# Patient Record
Sex: Female | Born: 2000 | Race: White | Hispanic: No | Marital: Single | Smoking: Never smoker
Health system: Southern US, Community
[De-identification: ages and names within clinical notes are randomized; demographics above are authoritative.]

## PROBLEM LIST (undated history)

## (undated) HISTORY — PX: WISDOM TOOTH EXTRACTION: SHX21

---
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
## 2019-03-15 ENCOUNTER — Ambulatory Visit (HOSPITAL_COMMUNITY)
Admission: EM | Admit: 2019-03-15 | Discharge: 2019-03-15 | Disposition: A | Discharge status: Home / Self Care | Payer: BC Managed Care – PPO | Attending: Family Medicine | Admitting: Family Medicine

## 2019-03-15 ENCOUNTER — Other Ambulatory Visit: Payer: Self-pay

## 2019-03-15 ENCOUNTER — Encounter (HOSPITAL_COMMUNITY): Payer: Self-pay

## 2019-03-15 DIAGNOSIS — J029 Acute pharyngitis, unspecified: Secondary | ICD-10-CM | POA: Diagnosis not present

## 2019-03-15 DIAGNOSIS — Z20828 Contact with and (suspected) exposure to other viral communicable diseases: Secondary | ICD-10-CM | POA: Insufficient documentation

## 2019-03-15 DIAGNOSIS — R05 Cough: Secondary | ICD-10-CM | POA: Diagnosis not present

## 2019-03-15 DIAGNOSIS — J069 Acute upper respiratory infection, unspecified: Secondary | ICD-10-CM

## 2019-03-15 LAB — POCT RAPID STREP A: Streptococcus, Group A Screen (Direct): NEGATIVE

## 2019-03-15 MED ORDER — CETIRIZINE HCL 10 MG PO TABS: 10.0000 mg | ORAL_TABLET | Freq: Every day | ORAL | 0 refills | Status: DC

## 2019-03-15 MED ORDER — FLUTICASONE PROPIONATE 50 MCG/ACT NA SUSP: 1.0000 | Freq: Every day | NASAL | 2 refills | Status: DC

## 2019-03-15 NOTE — ED Triage Notes (Signed)
Pt report having nasal congestion, headache, sore throat  and cough x 3 days.

## 2019-03-15 NOTE — Discharge Instructions (Addendum)
Viral illness vs allergies You can keep taking the OTC cold medication as needed.  Flonase and Zyrtec sent to the pharmacy for symptoms We will call you if your COVID test is positive. Rest, stay hydrated Follow up as needed for continued or worsening symptoms

## 2019-03-15 NOTE — ED Provider Notes (Signed)
[facility]    CSN: [id number] Arrival date & time: 03/15/19  1214      History   Chief Complaint Chief Complaint  Patient presents with  . Appointment    appt 12:30  . Sore Throat  . Headache  . Nasal Congestion  . Cough    HPI Monica Joseph is a 18 y.o. female.    URI Presenting symptoms: congestion, cough, rhinorrhea and sore throat   Severity:  Moderate Duration:  2 days Timing:  Constant Progression:  Waxing and waning Chronicity:  New Relieved by:  Nothing Worsened by:  Nothing Ineffective treatments:  OTC medications Associated symptoms: sinus pain and sneezing   Associated symptoms: no arthralgias, no myalgias, no neck pain, no swollen glands and no wheezing   Risk factors: no recent illness, no recent travel and no sick contacts     History reviewed. No pertinent past medical history.  There are no active problems to display for this patient.   Past Surgical History:  Procedure Laterality Date  . WISDOM TOOTH EXTRACTION      OB History   No obstetric history on file.      Home Medications    Prior to Admission medications   Medication Sig Start Date End Date Taking? Authorizing Provider  DM-Phenylephrine-Acetaminophen (TYLENOL COLD MAX PO) Take by mouth.   Yes [provider]  cetirizine (ZYRTEC) 10 MG tablet Take 1 tablet (10 mg total) by mouth daily. 03/15/19   Tamerra Merkley, Gloris Manchester A, NP  fluticasone (FLONASE) 50 MCG/ACT nasal spray Place 1 spray into both nostrils daily. 03/15/19   Janace Aris, NP    Family History History reviewed. No pertinent family history.  Social History Social History   Tobacco Use  . Smoking status: Never Smoker  . Smokeless tobacco: Never Used  Substance Use Topics  . Alcohol use: Never    Frequency: Never  . Drug use: Not on file     Allergies   Amoxicillin   Review of Systems Review of Systems  HENT: Positive for congestion, rhinorrhea, sinus pain, sneezing and sore throat.    Respiratory: Positive for cough. Negative for wheezing.   Musculoskeletal: Negative for arthralgias, myalgias and neck pain.     Physical Exam Triage Vital Signs ED Triage Vitals  Enc Vitals Group     BP 03/15/19 1303 125/80     Pulse Rate 03/15/19 1303 79     Resp 03/15/19 1303 15     Temp 03/15/19 1303 98.2 F (36.8 C)     Temp Source 03/15/19 1303 Temporal     SpO2 03/15/19 1303 98 %     Weight --      Height --      Head Circumference --      Peak Flow --      Pain Score 03/15/19 1301 2     Pain Loc --      Pain Edu? --      Excl. in GC? --    No data found.  Updated Vital Signs BP 125/80 (BP Location: Right Arm)   Pulse 79   Temp 98.2 F (36.8 C) (Temporal)   Resp 15   SpO2 98%   Visual Acuity Right Eye Distance:   Left Eye Distance:   Bilateral Distance:    Right Eye Near:   Left Eye Near:    Bilateral Near:     Physical Exam Vitals signs and nursing note reviewed.  Constitutional:  General: She is not in acute distress.    Appearance: She is well-developed. She is not ill-appearing, toxic-appearing or diaphoretic.  HENT:     Head: Normocephalic and atraumatic.     Right Ear: Tympanic membrane and ear canal normal.     Left Ear: Tympanic membrane and ear canal normal.     Nose: Congestion and rhinorrhea present.     Mouth/Throat:     Pharynx: Oropharynx is clear. Posterior oropharyngeal erythema present.     Tonsils: 1+ on the right. 1+ on the left.  Eyes:     Conjunctiva/sclera: Conjunctivae normal.  Neck:     Musculoskeletal: Normal range of motion.  Cardiovascular:     Rate and Rhythm: Normal rate and regular rhythm.  Pulmonary:     Effort: Pulmonary effort is normal.     Breath sounds: Normal breath sounds.  Skin:    General: Skin is warm.  Neurological:     Mental Status: She is alert.  Psychiatric:        Mood and Affect: Mood normal.      UC Treatments / Results  Labs (all labs ordered are listed, but only abnormal  results are displayed) Labs Reviewed  NOVEL CORONAVIRUS, NAA (HOSP ORDER, SEND-OUT TO REF LAB; TAT 18-24 HRS)  CULTURE, GROUP A STREP [facility])  POCT RAPID STREP A    EKG   Radiology No results found.  Procedures Procedures (including critical care time)  Medications Ordered in UC Medications - No data to display  Initial Impression / Assessment and Plan / UC Course  I have reviewed the triage vital signs and the nursing notes.  Pertinent labs & imaging results that were available during my care of the patient were reviewed by me and considered in my medical decision making (see chart for details).     Viral URI-over-the-counter symptomatic treatment Flonase and Zyrtec for symptoms COVID test pending Rapid strep test negative Rest and stay hydrated Follow up as needed for continued or worsening symptoms  Final Clinical Impressions(s) / UC Diagnoses   Final diagnoses:  Viral URI with cough     Discharge Instructions     Viral illness vs allergies You can keep taking the OTC cold medication as needed.  Flonase and Zyrtec sent to the pharmacy for symptoms We will call you if your COVID test is positive. Rest, stay hydrated Follow up as needed for continued or worsening symptoms     ED Prescriptions    Medication Sig Dispense Auth. Provider   fluticasone (FLONASE) 50 MCG/ACT nasal spray Place 1 spray into both nostrils daily. 16 g Ambri Miltner A, NP   cetirizine (ZYRTEC) 10 MG tablet Take 1 tablet (10 mg total) by mouth daily. 30 tablet Loura Halt A, NP     PDMP not reviewed this encounter.   Loura Halt A, NP 03/15/19 1332

## 2019-03-16 LAB — NOVEL CORONAVIRUS, NAA (HOSP ORDER, SEND-OUT TO REF LAB; TAT 18-24 HRS): SARS-CoV-2, NAA: NOT DETECTED

## 2019-03-18 LAB — CULTURE, GROUP A STREP (THRC)

## 2019-04-21 IMAGING — CT CT MAXILLOFACIAL W/O CM
3 series · 16 of 47 positions shown, 19 images · non-contrast
Comparison: None.

CLINICAL DATA: Hit on right side of jaw with softball, with right
jaw pain. Initial encounter.

EXAM:
CT MAXILLOFACIAL WITHOUT CONTRAST
TECHNIQUE: Multidetector CT imaging of the maxillofacial structures was
performed. Multiplanar CT image reconstructions were also generated.

[Series 3: facial st · axial · 0.33mm/px · z∈[-210,-86]mm · 10 of 72 slices shown, 13 images]
[im 5/72  brain]
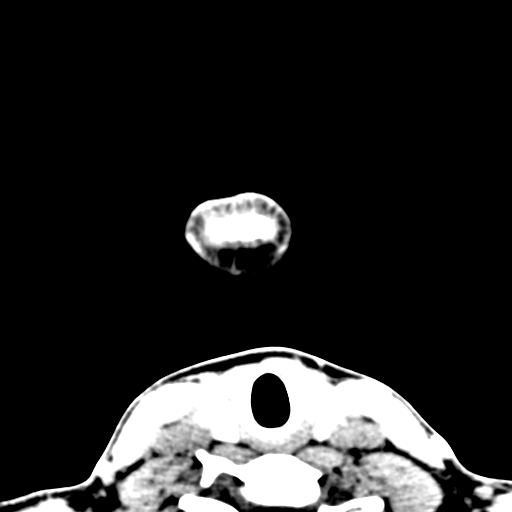
[im 5/72  bone]
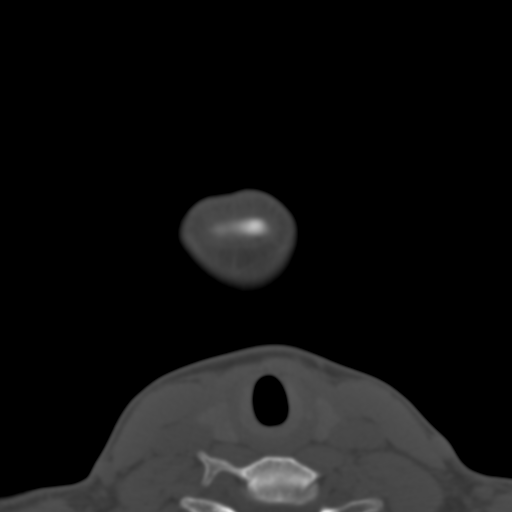
[im 13/72  bone]
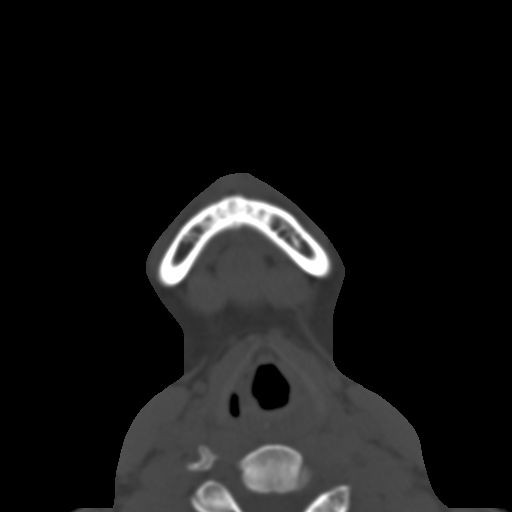
[im 20/72  bone]
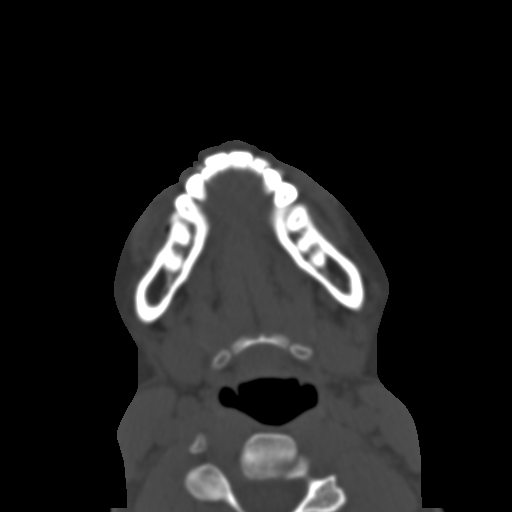
[im 25/72  bone]
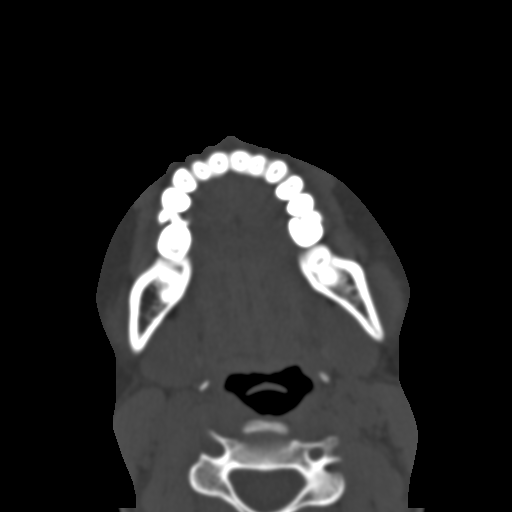
[im 32/72  brain]
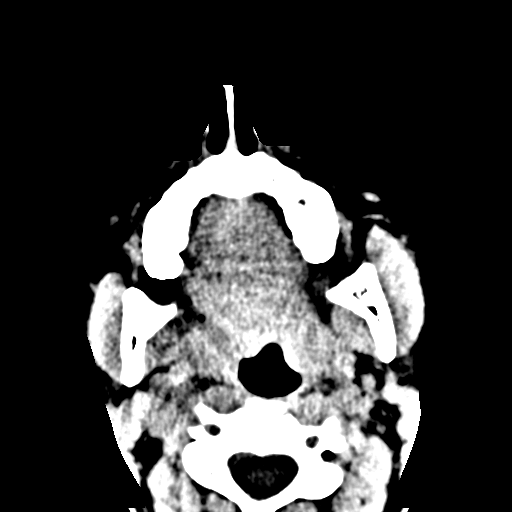
[im 32/72  bone]
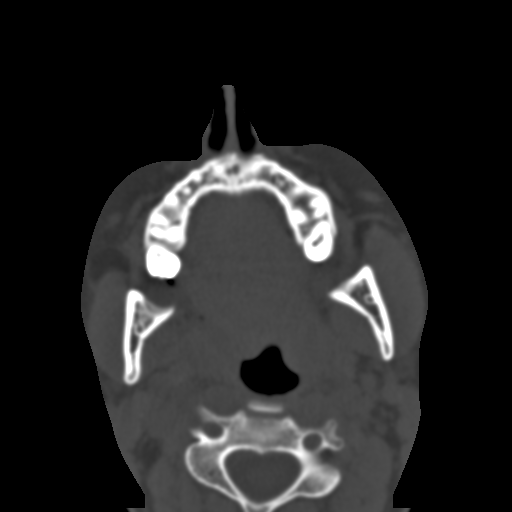
[im 40/72  bone]
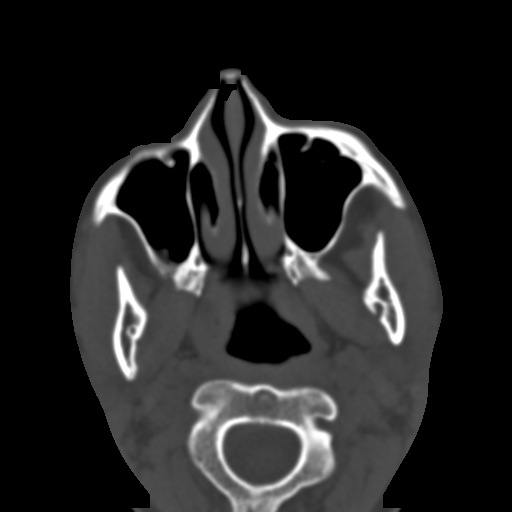
[im 47/72  bone]
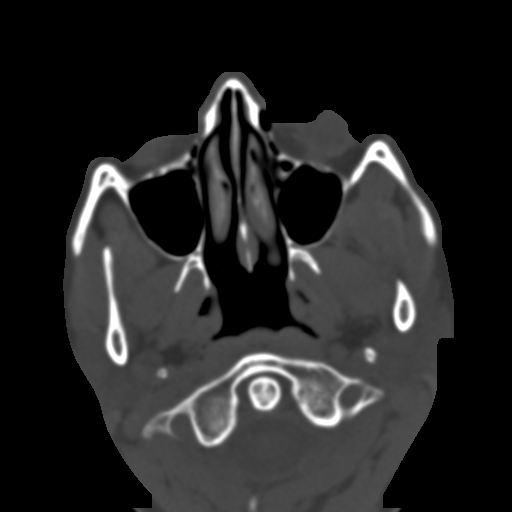
[im 54/72  bone]
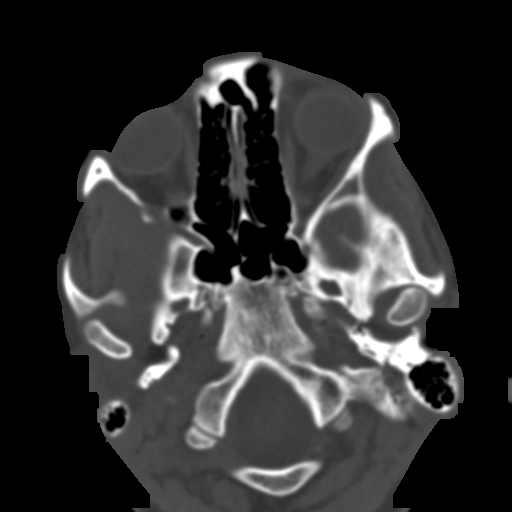
[im 59/72  brain]
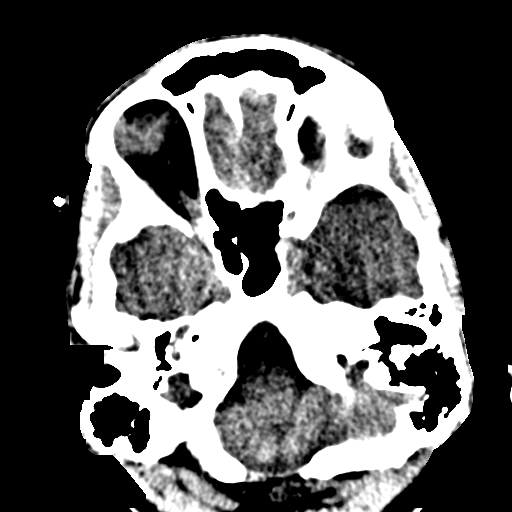
[im 59/72  bone]
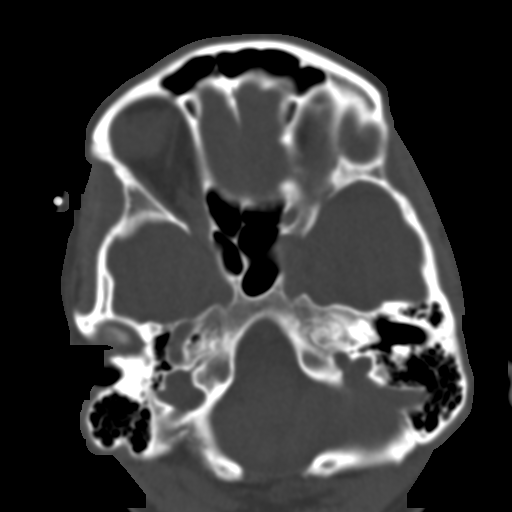
[im 67/72  bone]
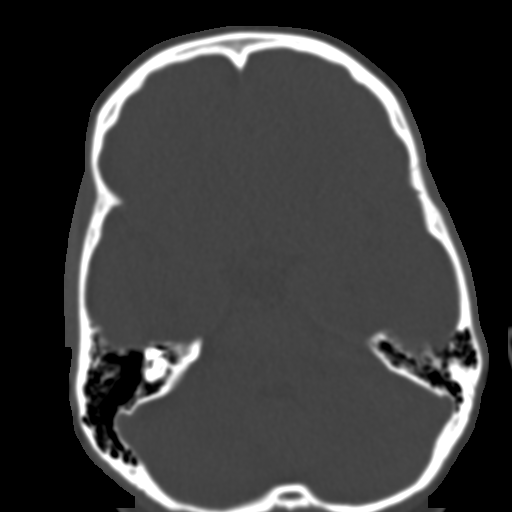

[Series 5: coronal st · coronal · 0.30mm/px · 3 of 78 slices shown]
[im 26/78  bone]
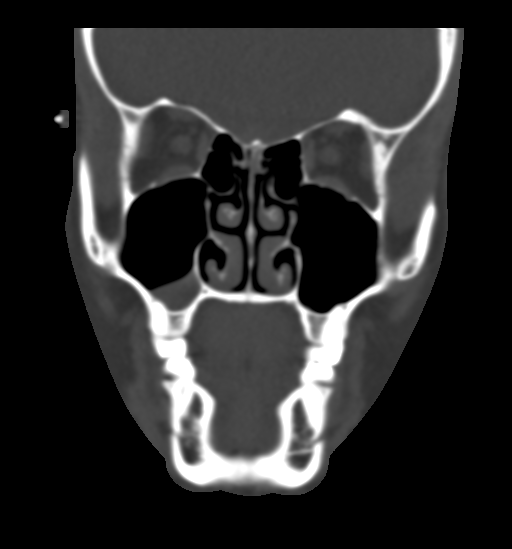
[im 35/78  bone]
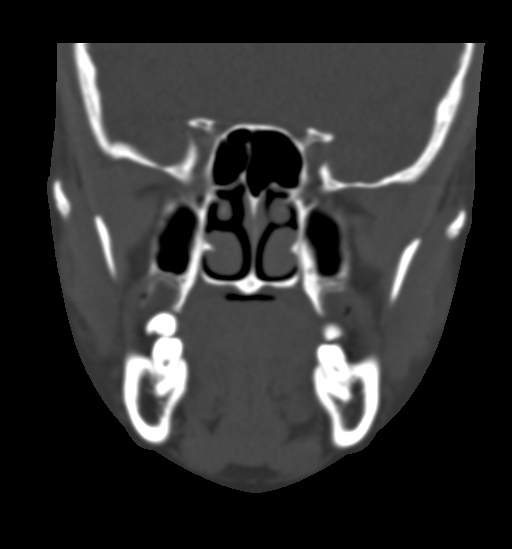
[im 43/78  bone]
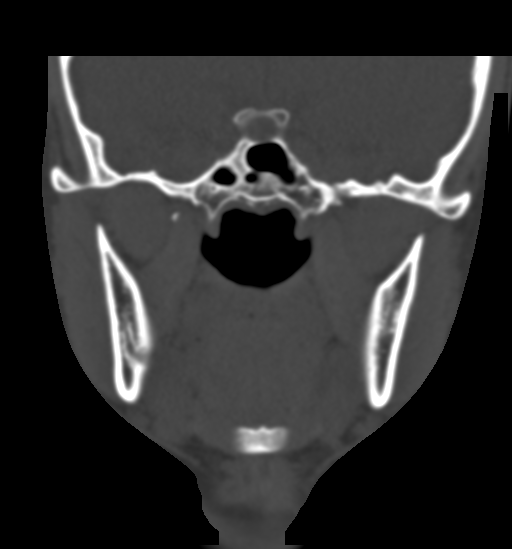

[Series 6: sagittal st · sagittal · 0.30mm/px · 3 of 73 slices shown]
[im 25/73  bone]
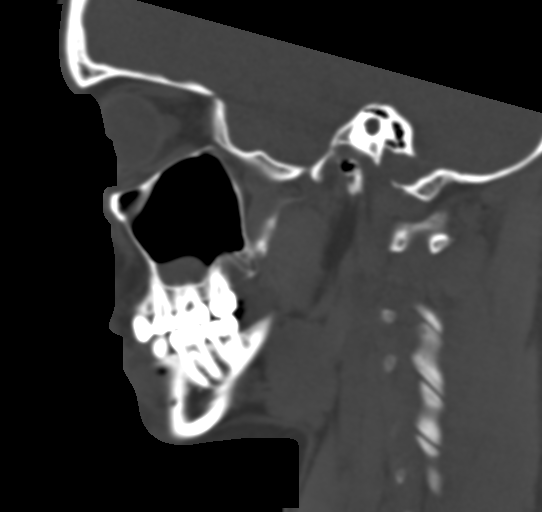
[im 37/73  bone]
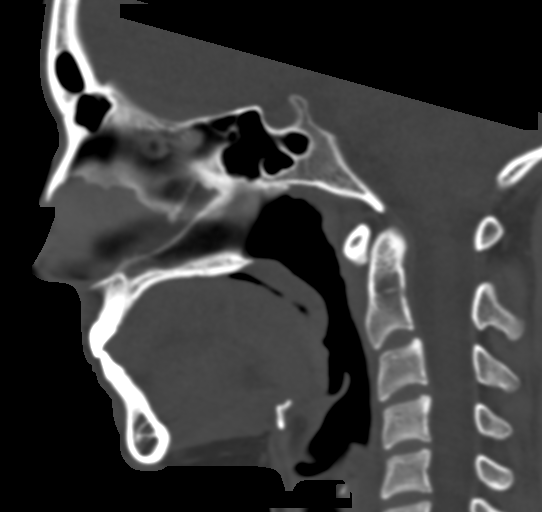
[im 49/73  bone]
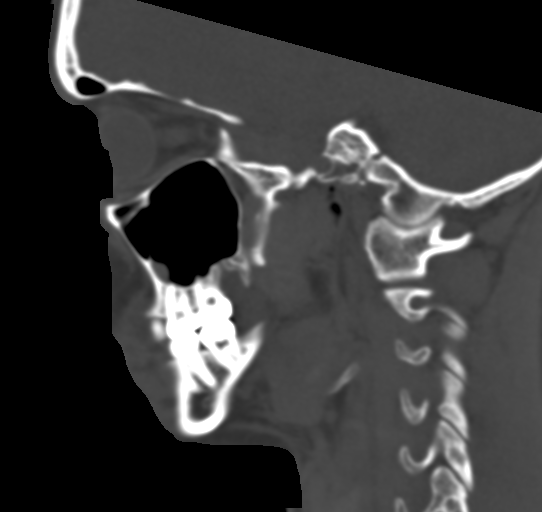

[16 of 47 positions shown; findings below may reference images not displayed]

FINDINGS: Osseous: There is no evidence of fracture or dislocation. The
maxilla and mandible appear intact. The nasal bone is unremarkable
in appearance. The visualized dentition demonstrates no acute
abnormality.

Orbits: The orbits are intact bilaterally.

Sinuses: A mucus retention cyst or polyp is noted at the base of the
right maxillary sinus. The remaining visualized paranasal sinuses
and mastoid air cells are well-aerated.

Soft tissues: Mild soft tissue swelling is noted overlying the right
mandible. The parapharyngeal fat planes are preserved. The
nasopharynx, oropharynx and hypopharynx are unremarkable in
appearance. The visualized portions of the valleculae and piriform
sinuses are grossly unremarkable.

Limited intracranial: The parotid and submandibular glands are
within normal limits. No cervical lymphadenopathy is seen.
IMPRESSION: 1. No evidence of fracture or dislocation.
2. Mild soft tissue swelling overlying the right mandible.
3. Mucus retention cyst or polyp at the base of the right maxillary
sinus.

## 2021-01-06 ENCOUNTER — Ambulatory Visit
Admission: EM | Admit: 2021-01-06 | Discharge: 2021-01-06 | Disposition: A | Discharge status: Home / Self Care | Payer: BC Managed Care – PPO | Attending: Urgent Care | Admitting: Urgent Care

## 2021-01-06 ENCOUNTER — Other Ambulatory Visit: Payer: Self-pay

## 2021-01-06 DIAGNOSIS — K5900 Constipation, unspecified: Secondary | ICD-10-CM | POA: Diagnosis not present

## 2021-01-06 DIAGNOSIS — R5383 Other fatigue: Secondary | ICD-10-CM

## 2021-01-06 MED ORDER — DOCUSATE SODIUM 100 MG PO CAPS: 100.0000 mg | ORAL_CAPSULE | Freq: Two times a day (BID) | ORAL | 0 refills | Status: AC

## 2021-01-06 MED ORDER — FLEET ENEMA 7-19 GM/118ML RE ENEM: 1.0000 | ENEMA | Freq: Every day | RECTAL | 0 refills | Status: AC | PRN

## 2021-01-06 NOTE — ED Triage Notes (Signed)
Pt c/o constipation 2wks with lower abdominal pain. States had a small BM 2 days ago.

## 2021-01-06 NOTE — ED Provider Notes (Signed)
[facility]   MRN: [id number] DOB: 03-01-2001  Subjective:   Monica Joseph is a 20 y.o. female presenting for 2 week history of new onset constipation. Has used Miralax and milk of magnesia and was able to have 2 small bowel movements. Has felt abdominal bloating, occasional abdominal pain. Has never had any issues with constipation. Patient eats plenty of fiber, is active physically. Drinks 5-6 water bottles a day. Denies fever, n/v, chest pain, cough, shob, rashes. Family history of diabetes. Denies smoking cigarettes or drinking alcohol. Has had her pap smear, no gynecologic issues to her knowledge.   No current facility-administered medications for this encounter. No current outpatient medications on file.   Allergies  Allergen Reactions   Amoxicillin     History reviewed. No pertinent past medical history.   Past Surgical History:  Procedure Laterality Date   WISDOM TOOTH EXTRACTION      History reviewed. No pertinent family history.  Social History   Tobacco Use   Smoking status: Never   Smokeless tobacco: Never  Substance Use Topics   Alcohol use: Never    Comment: occ    ROS   Objective:   Vitals: BP 135/84 (BP Location: Left Arm)   Pulse 96   Temp 98 F (36.7 C) (Oral)   Resp 18   LMP 12/23/2020   SpO2 99%   Physical Exam Constitutional:      General: She is not in acute distress.    Appearance: Normal appearance. She is well-developed and normal weight. She is not ill-appearing, toxic-appearing or diaphoretic.  HENT:     Head: Normocephalic and atraumatic.     Right Ear: External ear normal.     Left Ear: External ear normal.     Nose: Nose normal.     Mouth/Throat:     Mouth: Mucous membranes are moist.     Pharynx: Oropharynx is clear.  Eyes:     General: No scleral icterus.    Extraocular Movements: Extraocular movements intact.     Pupils: Pupils are equal, round, and reactive to light.  Cardiovascular:     Rate and Rhythm:  Normal rate and regular rhythm.     Pulses: Normal pulses.     Heart sounds: Normal heart sounds. No murmur heard.   No friction rub. No gallop.  Pulmonary:     Effort: Pulmonary effort is normal. No respiratory distress.     Breath sounds: Normal breath sounds. No stridor. No wheezing, rhonchi or rales.  Abdominal:     General: Bowel sounds are normal. There is no distension.     Palpations: Abdomen is soft. There is no mass.     Tenderness: There is no abdominal tenderness. There is no right CVA tenderness, left CVA tenderness, guarding or rebound.  Skin:    General: Skin is warm and dry.     Coloration: Skin is not pale.     Findings: No rash.  Neurological:     General: No focal deficit present.     Mental Status: She is alert and oriented to person, place, and time.  Psychiatric:        Mood and Affect: Mood normal.        Behavior: Behavior normal.        Thought Content: Thought content normal.        Judgment: Judgment normal.    Assessment and Plan :   PDMP not reviewed this encounter.  1. Constipation, unspecified constipation type   2.  Other fatigue     New onset constipation. No obvious contributing factors, low risk for obstruction as there is no surgery and has recently had 2 small bowel movements, discussed low possibility of malignancy but has not had any family history of colon cancer; there is minimal gynecologic history. Will check for TSH given her recent fatigue. Counseled on general management of constipation including enema or Miralax with stool softener and encouraged continued healthy diet and active lifestyle. Establish care with a new PCP for follow up and further work up. Counseled patient on potential for adverse effects with medications prescribed/recommended today, ER and return-to-clinic precautions discussed, patient verbalized understanding.    Wallis B[place], [place] 01/07/21 [phone]

## 2021-01-06 NOTE — Discharge Instructions (Addendum)
For moderate to severe constipation (not having a bowel movement in more than 3 days) then try to use an enema or Miralax once daily until you have a good bowel movement.  It is not a good idea to use an enema or laxatives daily. If you find you are doing this, then please follow up with a gastroenterologist. Otherwise, a medication you could use daily to help with promoting bowel movements is docusate (Colace) 100mg. It is okay to use this 1-2 times daily as a stool softener.  Try to stay active physically including regular exercise 2-3 times a week.  Make sure you hydrate well every day with about 64 ounces of water daily (that is 2 liters).  Try to avoid carb heavy foods, dairy. This includes cutting out breads, pasta, pizza, pastries, potatoes, rice, starchy foods in general. Eat more fiber as listed below:  Salads - kale, spinach, cabbage, spring mix, arugula Fruits - avocadoes, berries (blueberries, raspberries, blackberries), apples, oranges, pomegranate, grapefruit, kiwi Vegetables - asparagus, cauliflower, broccoli, green beans, brussel sprouts, bell peppers, beets; stay away from or limit starchy vegetables like potatoes, carrots, peas Other general foods - kidney beans, egg whites, almonds, walnuts, sunflower seeds, pumpkin seeds, fat free yogurt, almond milk, flax seeds, quinoa, oats  Meat - It is better to eat lean meats and limit your red meat including pork to once a week.  Wild caught fish, chicken breast are good options as they tend to be leaner sources of good protein. Still be mindful of the sodium labels for the meats you buy.  DO NOT EAT ANY FOODS ON THIS LIST THAT YOU ARE ALLERGIC TO. For more specific needs, I highly recommend consulting a dietician or nutritionist but this can definitely be a good starting point.  

## 2021-01-08 LAB — TSH: TSH: 0.64 u[IU]/mL (ref 0.450–4.500)
